# Patient Record
Sex: Female | Born: 1975 | Race: Black or African American | Hispanic: No | Marital: Married | Smoking: Never smoker
Health system: Southern US, Community
[De-identification: ages and names within clinical notes are randomized; demographics above are authoritative.]

## PROBLEM LIST (undated history)

## (undated) ENCOUNTER — Inpatient Hospital Stay (HOSPITAL_COMMUNITY): Payer: Self-pay

---

## 2001-12-03 ENCOUNTER — Other Ambulatory Visit: Admission: RE | Admit: 2001-12-03 | Discharge: 2001-12-03 | Payer: Self-pay | Admitting: Gynecology

## 2002-11-21 ENCOUNTER — Other Ambulatory Visit: Admission: RE | Admit: 2002-11-21 | Discharge: 2002-11-21 | Payer: Self-pay | Admitting: Gynecology

## 2003-11-24 ENCOUNTER — Other Ambulatory Visit: Admission: RE | Admit: 2003-11-24 | Discharge: 2003-11-24 | Payer: Self-pay | Admitting: Gynecology

## 2004-11-24 ENCOUNTER — Other Ambulatory Visit: Admission: RE | Admit: 2004-11-24 | Discharge: 2004-11-24 | Payer: Self-pay | Admitting: Gynecology

## 2005-12-02 ENCOUNTER — Other Ambulatory Visit: Admission: RE | Admit: 2005-12-02 | Discharge: 2005-12-02 | Payer: Self-pay | Admitting: Gynecology

## 2006-05-13 ENCOUNTER — Emergency Department (HOSPITAL_COMMUNITY): Admission: EM | Admit: 2006-05-13 | Discharge: 2006-05-13 | Payer: Self-pay | Admitting: Emergency Medicine

## 2006-10-28 ENCOUNTER — Inpatient Hospital Stay (HOSPITAL_COMMUNITY): Admission: AD | Admit: 2006-10-28 | Discharge: 2006-10-28 | Payer: Self-pay | Admitting: Obstetrics and Gynecology

## 2006-10-29 ENCOUNTER — Inpatient Hospital Stay (HOSPITAL_COMMUNITY): Admission: AD | Admit: 2006-10-29 | Discharge: 2006-10-31 | Payer: Self-pay | Admitting: Obstetrics and Gynecology

## 2007-12-16 ENCOUNTER — Inpatient Hospital Stay (HOSPITAL_COMMUNITY): Admission: AD | Admit: 2007-12-16 | Discharge: 2007-12-17 | Payer: Self-pay | Admitting: Obstetrics and Gynecology

## 2008-04-10 IMAGING — CR DG HAND COMPLETE 3+V*R*
3 series · 3 of 3 positions shown · non-contrast
Comparison: none

CLINICAL DATA: Right hand pain

RIGHT HAND - 3  VIEW:

[x hand ap right]
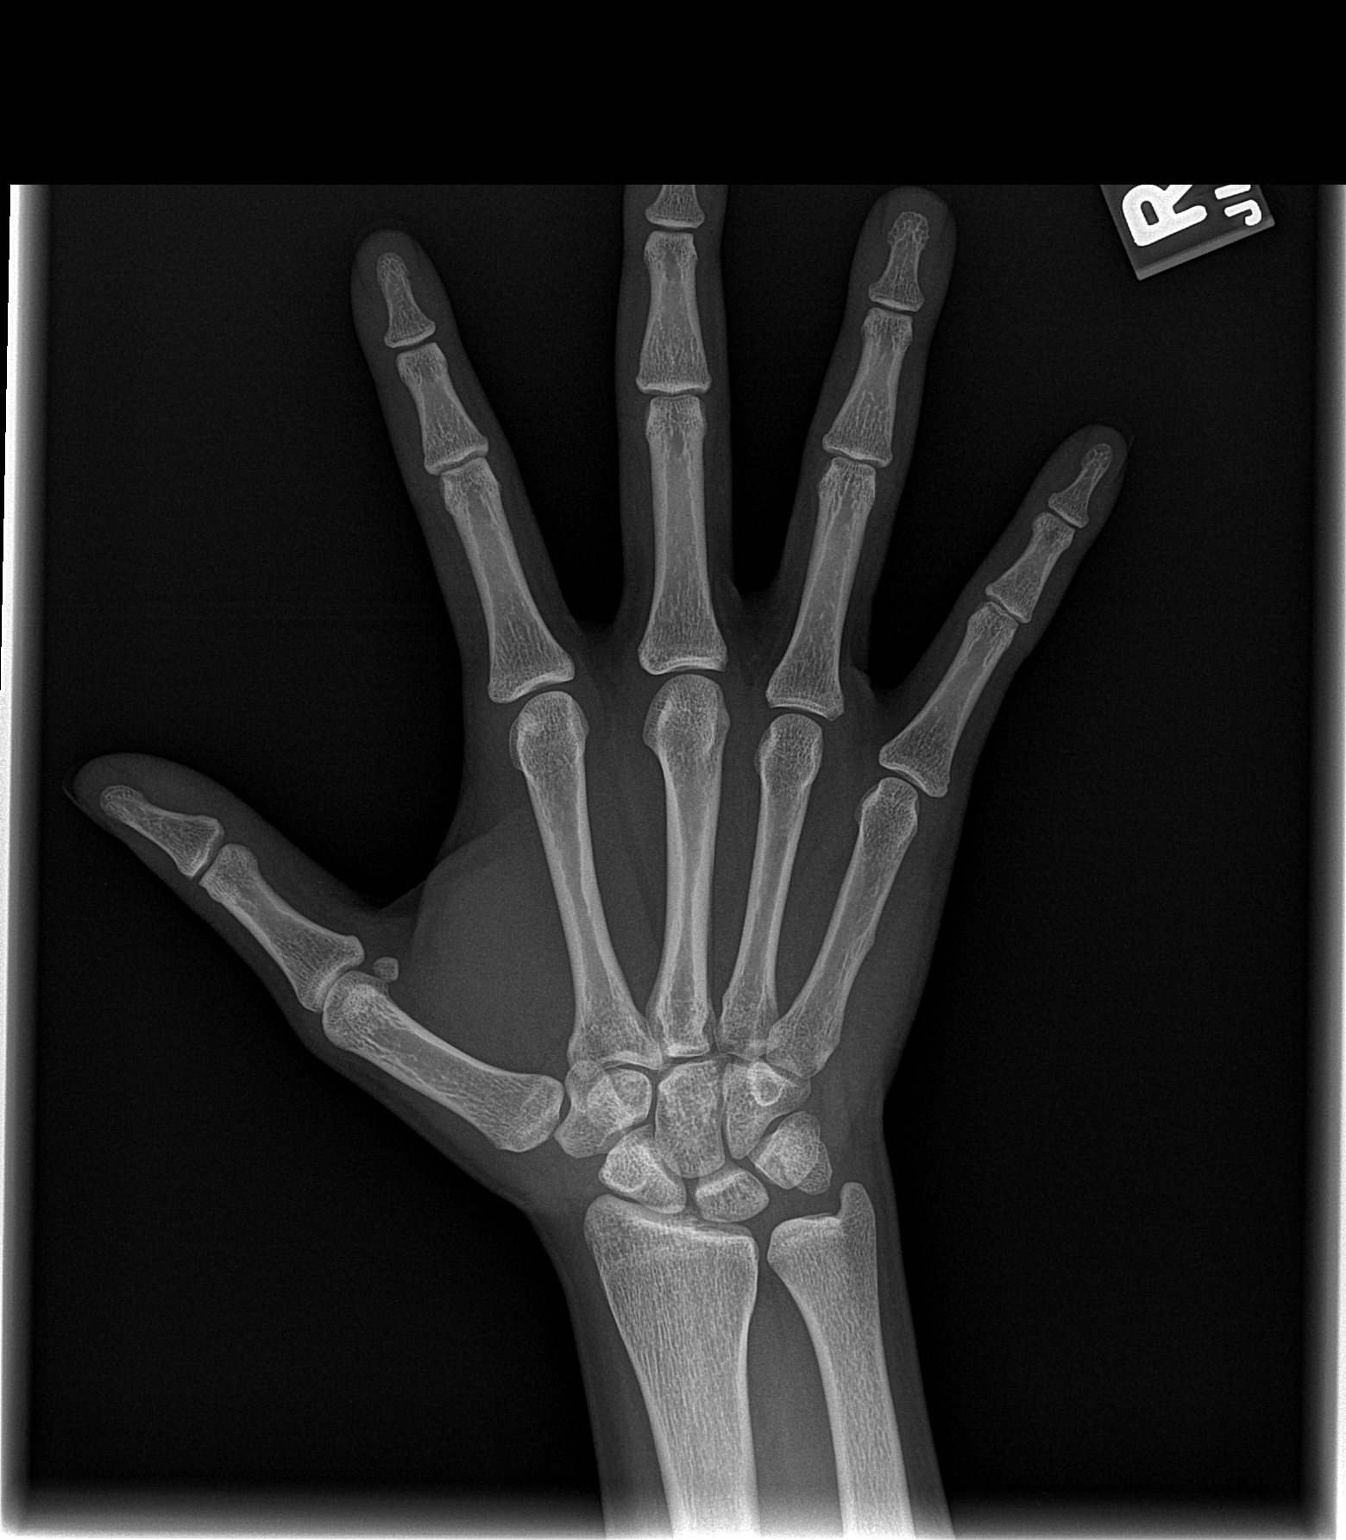

[x hand oblique right]
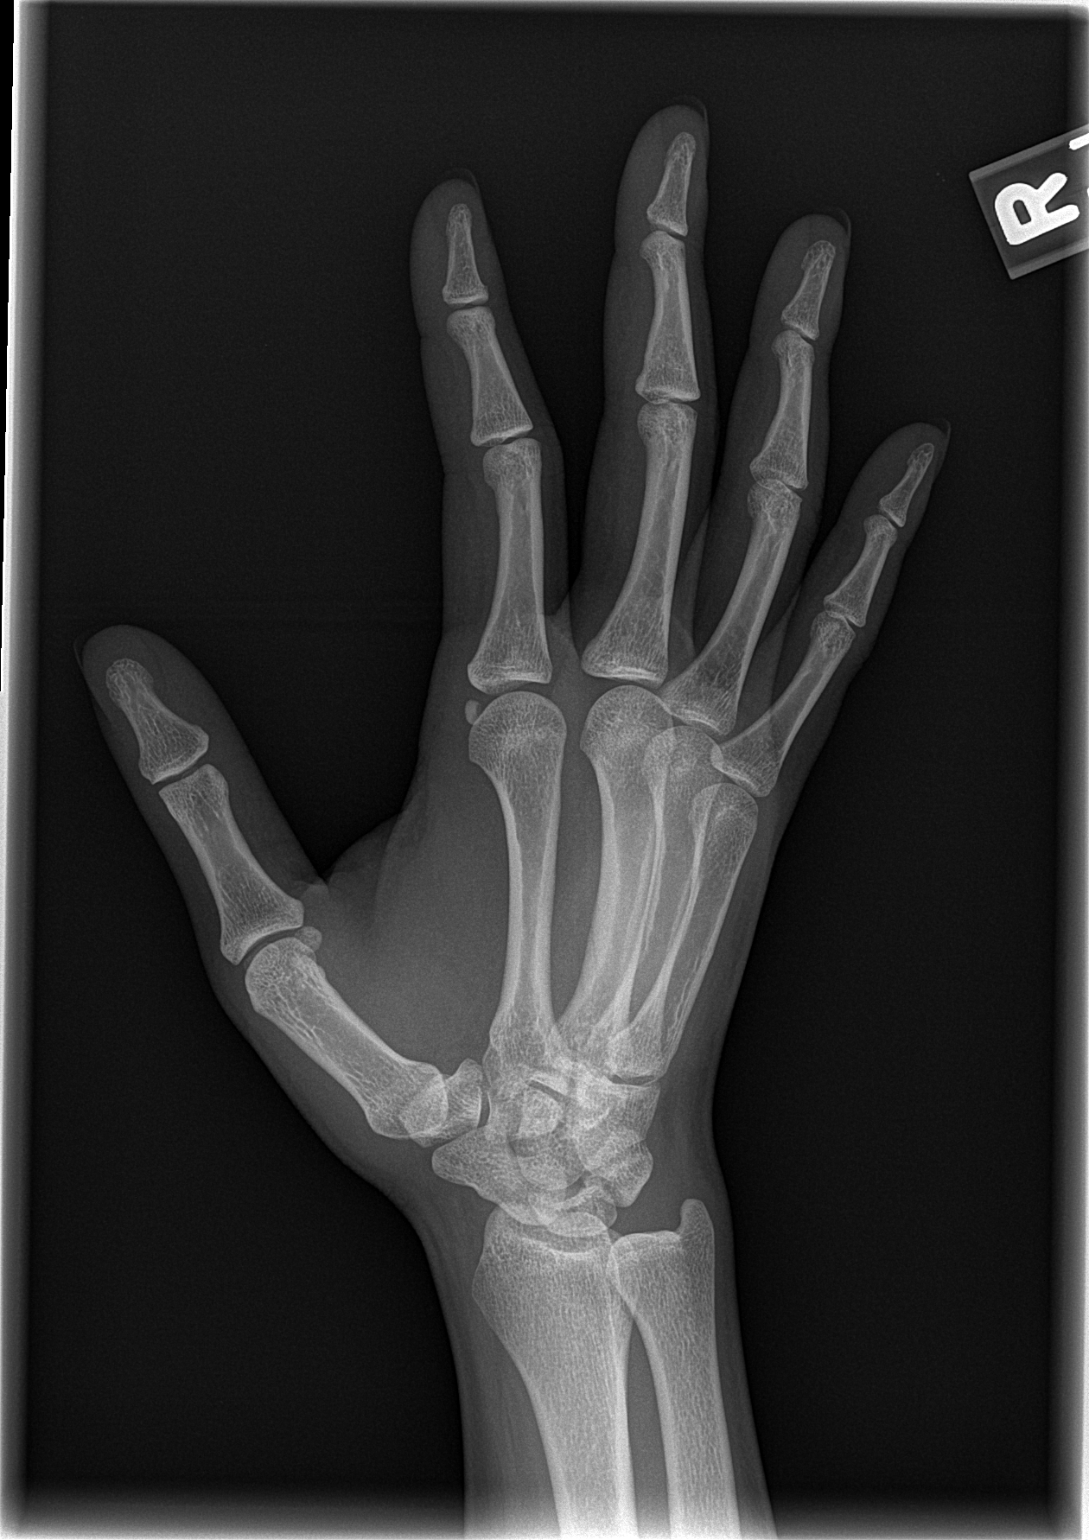

[x hand lat right]
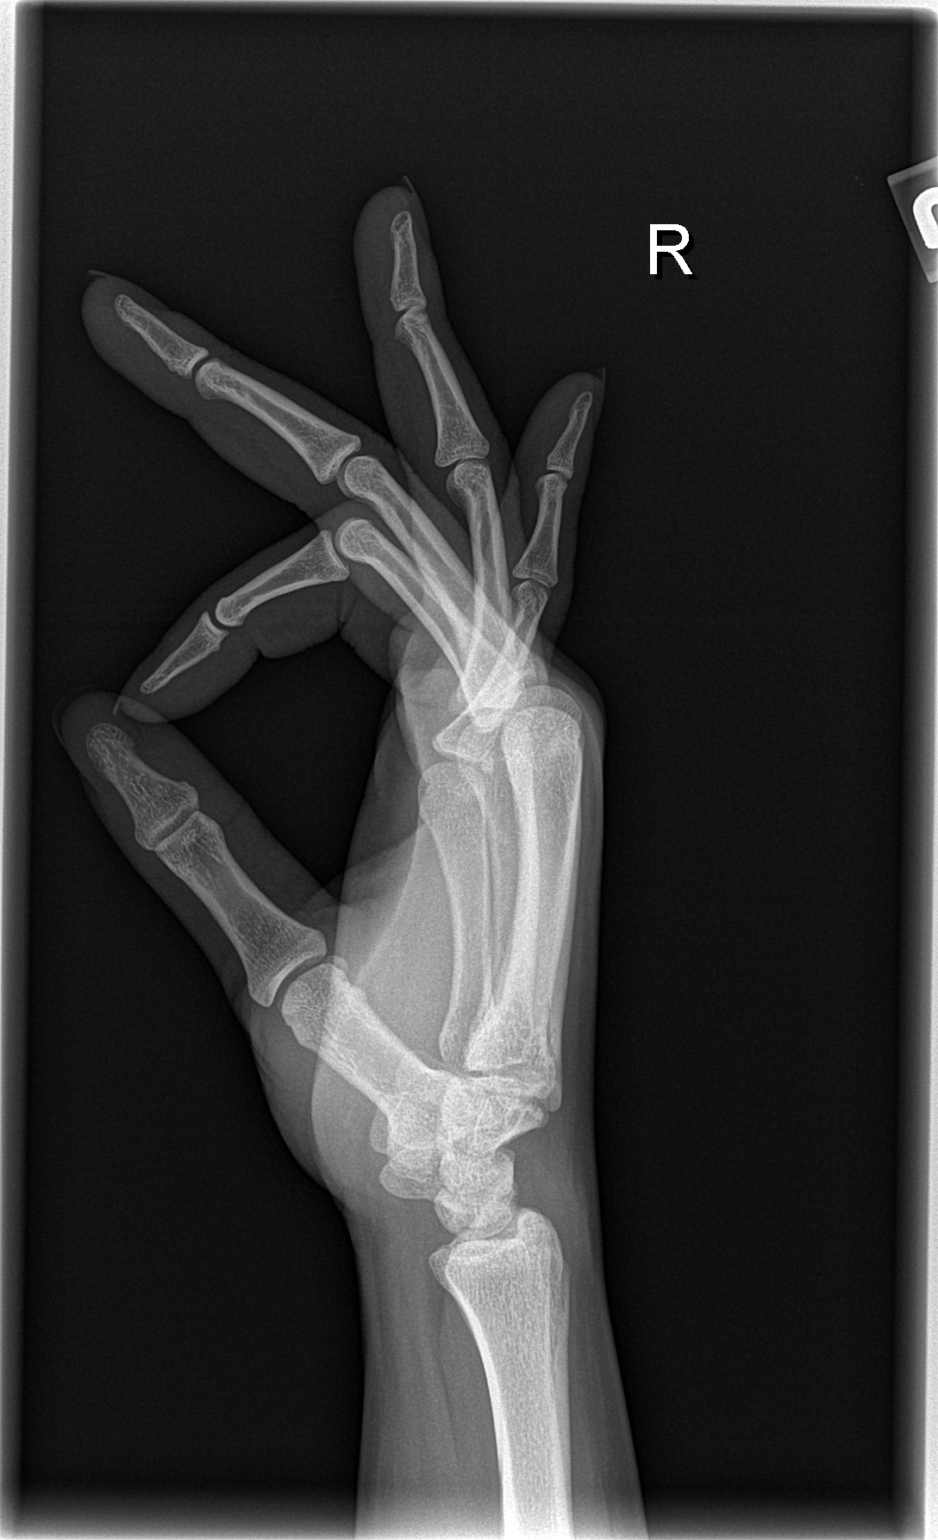

[3 of 3 positions shown; findings below may reference images not displayed]

FINDINGS: There is no evidence of fracture or dislocation.  There is no
evidence of arthropathy or other focal bone abnormality.  Soft tissues are
unremarkable.
IMPRESSION: Negative.

## 2009-05-24 ENCOUNTER — Inpatient Hospital Stay (HOSPITAL_COMMUNITY): Admission: AD | Admit: 2009-05-24 | Discharge: 2009-05-26 | Payer: Self-pay | Admitting: Obstetrics and Gynecology

## 2010-04-13 LAB — RPR: RPR Ser Ql: NONREACTIVE

## 2010-04-13 LAB — CBC
MCHC: 34.3 g/dL (ref 30.0–36.0)
MCV: 102.5 fL — ABNORMAL HIGH (ref 78.0–100.0)
Platelets: 136 10*3/uL — ABNORMAL LOW (ref 150–400)
Platelets: 159 10*3/uL (ref 150–400)
RDW: 13.4 % (ref 11.5–15.5)

## 2010-06-08 NOTE — H&P (Signed)
[name] Allen, [name] Allen                ACCOUNT NO.:  [id number]   MEDICAL RECORD NO.:  [id number]          PATIENT TYPE:  INP   LOCATION:  9170                          [facility]   PHYSICIAN:  Osborn Coho, M.D.   DATE OF BIRTH:  August 09, 1975   DATE OF ADMISSION:  12/16/2007  DATE OF DISCHARGE:                              HISTORY & PHYSICAL   [name] Allen is a 35 year old gravida 2, para 1-0-0-1 at 94 weeks who  presents today with spontaneous rupture of membranes at approximately  1:15 a.m. this morning with clear fluid noted and uterine contractions  every 2 minutes.  The patient has noted positive fetal movement.  She  denies any bleeding.  She had been 3-4 cm in the office this week.  Pregnancy remarkable for:   1. Second pregnancy in less than 1 year.  2. Questionable LMP.  3. Negative group B strep.  4. Some type of lip ulcers seen by a dermatologist that was treated      with antibiotics but no clear HSV diagnosis.   The patient also denies any prodrome or any other issues.   PRENATAL LABS:  Blood type is O+, Rh antibody negative, VDRL  nonreactive, rubella titer positive, hepatitis B surface antigen  negative, HIV nonreactive.  Hemoglobin electrophoresis was normal in  2008.  Cystic fibrosis testing was negative in 2008.  Pap was normal in  November of 2008.  GC and Chlamydia cultures were declined.  Hemoglobin  upon entering the practice was 13.7.  It was 12.9 at 27 weeks.  The  patient had a normal first trimester screen and declines AFP.  She had a  normal Glucola.  She had a Group B strep negative at 35 weeks and GC and  Chlamydia cultures were also negative at that time.   HISTORY OF PRESENT PREGNANCY:  The patient entered care at approximately  11 weeks.  She had an ultrasound at 9 weeks 4 days for dating secondary  to questionable LMP and also conception while breast-feeding.  The  patient a normal first trimester screen.  She declined AFP.  She had a  normal  ultrasound at 19 weeks showing normal growth and anterior  placenta and normal fluid.  The patient had a normal Glucola.  During  her pregnancy the patient was discussing issues of contraception and was  undecided about what she wanted to do.  She was having some lip ulcers  and fever type blisters.  She was seen by her primary and was prescribed  cephalexin.  She is group B strep culture at 36 weeks known negative  cultures.  She declined flu vaccines.  Rest of her pregnancy was  essentially uncomplicated.  Earlier this week she had an evaluation in  the office on Thursday showing cervix 3+.   OBSTETRICAL HISTORY:  In 2008 October she had a vaginal birth of a  female infant, weight 6 pounds 15 ounces at 40-2/7 weeks.  She was in  labor 15 hours.  She had epidural anesthesia.  She had no complications.   MEDICAL HISTORY:  She is a  previous condom, oral contraceptive Micronor  user.  She had one abnormal Pap in 2003.  In 2002 she had Chlamydia that  was treated.  She reports the usual childhood illnesses.  She has no  known medication allergies.   FAMILY HISTORY:  Paternal aunt had kidney stones.  She had a past  history that is remarkable for a first cousin having sickle cell trait.   SOCIAL HISTORY:  The patient is [place] and of the [place]  faith.  She is married to the father of baby.  He is involved and  supportive and his name is Chrishawn Kring.  The patient is college  educated.  She is a Teacher, early years/pre.  Her husband has 2 years of  college.  He is a Runner, broadcasting/film/video.  She has been followed by the certified nurse  midwife service [facility].  She denies any alcohol, drug or  tobacco use during this pregnancy.   PHYSICAL EXAM:  VITAL SIGNS:  Stable.  The patient is febrile.  HEENT:  Within normal limits.  LUNGS:  Breath sounds are clear.  HEART:  Regular rate and rhythm without murmur.  BREASTS:  Soft and nontender.  ABDOMEN:  Fundal height is approximately  39 cm.  Estimated fetal weight  is 7-7-1/2 pounds.  Uterine contractions every 2 minutes, moderate  quality.  The patient is noted to be leaking clear fluid.  Cervix is 5-  6, 80% vertex at a -1 station with verification of leaking fluid noted.  Fetal heart rate currently is nonreactive.  There are no decelerations  noted at present.  No HSV type lesions are noted on the vulva or vagina  or cervix.  EXTREMITIES:  Deep tendon reflexes are 2+ without clonus.  There is a  trace edema noted.   IMPRESSION:  1. Intrauterine pregnancy at 41 weeks.  2. Active labor with spontaneous rupture of membranes.  3. Group B strep negative.  4. No evidence of any HSV (herpes simplex virus) lesions.   PLAN:  1. Admit to birthing suite for consult with Dr. Osborn Coho as      attending physician.  2. Routine certified nurse midwife orders.  3. The patient does plan epidural.      Chip Boer L. Emilee Hero, C.N.M.      Osborn Coho, M.D.  Electronically Signed    VLL/MEDQ  D:  12/16/2007  T:  12/16/2007  Job:  [id number]

## 2010-06-08 NOTE — H&P (Signed)
[name], [name]                ACCOUNT NO.:  [id number]   MEDICAL RECORD NO.:  [id number]          PATIENT TYPE:  INP   LOCATION:  9162                          [facility]ACILITY:  [facility]   PHYSICIAN:  Naima A. Dillard, M.D. DATE OF BIRTH:  21-Sep-1975   DATE OF ADMISSION:  10/29/2006  DATE OF DISCHARGE:                              HISTORY & PHYSICAL   Patient is a 35 year old married African-American female gravida 1, para  0, who is admitted at 66 and 2/7 weeks with complaints of regular  uterine contractions with increased intensity since approximately 7 p.m.  October 28, 2006.  The patient was previously seen in the Maternity  Admissions Unit on October 3 and was sent home due to no cervical  change.  Patient denies rupture of membranes or bleeding.  Patient  reports that her fetus has been moving normally.  The patient has noted  some bloody mucus, however.  Patient denies any headaches, vision  changes, right upper quadrant pain as well.  Patient is group B strep  negative.  Patient's pregnancy is remarkable for first trimester  bleeding.  Patient's pregnancy has been followed by the CNM service at  [facility].   ALLERGIES:  No known drug allergies.   OBSTETRICAL HISTORY:  Pregnancy #1 is current.   GYN HISTORY:  Patient with:  1. History of Chlamydia in 2002, that was treated.  2. Occasional vaginal yeast and bacterial infections.  3. Patient repeats a history of abnormal Pap in the past in 2003 and      2004; however, no treatment, and repeat Pap was within normal      limits per patient.  4. Patient's last Pap smear November, 2007, which was normal.   PAST MEDICAL HISTORY:  Negative.   PAST SURGICAL HISTORY:  Negative.   FAMILY HISTORY:  1. First cousin with sickle cell trait.  2. Paternal aunt with kidney stones.   GENETIC HISTORY:  Negative with the exception of cousin with sickle cell  trait.   SOCIAL HISTORY:  1. Patient is married to the father of the  baby, [name] Allen, who is      involved and supportive.  2. Patient reports affiliation with [name] Allen of [employer].  3. 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  4. Father of the baby is a Runner, broadcasting/film/video with 14 years of education.  5. Patient denies any tobacco, alcohol or street drug use.   PRENATAL LABS:  Initial hemoglobin of 14.3, hematocrit 41.6, platelets  219,000, blood type O positive, antibody screen negative, rubella titer  immune.  Hepatitis B Surface Antigen negative.  Cystic fibrosis testing  was negative.  Glucola 94 at 27 weeks.  HSV-2 titers negative at 31  weeks.  Group B strep negative.  Gonorrhea and Chlamydia cultures  negative at 34 weeks and 6 days.  Gonorrhea was negative.   HISTORY OF CURRENT PREGNANCY:  Patient entered care at [redacted] weeks  gestation.  Patient desired first trimester screening.  Patient  underwent first trimester screening which was within normal limits.  HIV  is negative.  Hemoglobin electrophoresis also returned as normal from  records reviewed from November, 2007, at a previous [name] Allen.  Ultrasound was performed at 18 weeks with normal anatomy and growth  consistent with dates, cervix 3.22 cm and anterior placenta.  Glucola  was performed at 27 weeks.  Patient with complaints of numbness in her  right fifth toe at night at 30 weeks which resolved spontaneously.  Patient with complaints of itching of the external genitalia at 31 weeks  with lesions noted of the genitalia.  At that time HSV-2 glycoproteins  were drawn.  HSV-1 and 2 glycoproteins were negative.  The patient's  husband also underwent HSV glycoprotein testing due to the previous  occurrence and also returned with negative HSV glycoproteins.  Patient  with complaints of pelvic pressure as well as urinary frequency and pain  at 34 weeks and 6 days.  At that time cervix was found to be 1.5 cm, 75%  effaced.  Patient was encouraged to rest and increase her fluids and was   given preterm labor precautions at that time.  The patient had  gonorrhea, Chlamydia cultures and GBS done at that time.  At 36 weeks,  group B strep was repeated and found to be negative.  Remainder of  patient's pregnancy has been unremarkable.   OBJECTIVE:  VITAL SIGNS:  Stable.  Blood pressure 117/65.  HEENT:  Within normal limits.  THYROID:  Normal.  CHEST:  Clear.  HEART:  Regular rate and rhythm.  ABDOMEN:  Gravid and nontender.  UTERUS:  Nontender.  FETAL HEART RATE:  Overall reassuring with variability present and 10 x  10 excels present but not technically reactive.  Uterine contractions  are noted to be every 2 to 4 minutes, strong to palpation.  CERVIX:  Cervical exam finds cervix to be 4 to 5 cm dilated, 80%  effaced, -2 station.  Vertex with a bulging bag of water.  A small  amount of bloody show.  EXTREMITIES:  With no edema noted.  Negative Homan's bilaterally.  Deep  tendon reflexes are 2+ with no clonus.   ASSESSMENT:  1. Intrauterine pregnancy at 40 and 2/[redacted] weeks gestation.  2. Labor.   PLAN:  1. Patient to be admitted to birthing suites per consult with Dr.      Normand Sloop.  2. Patient desires epidural anesthesia during labor which was ordered.  3. Routine admission orders.  4. Will observe carefully and continue expectant management at the      present time.      [name] Allen, CNM      Naima A. Normand Sloop, M.D.  Electronically Signed   NOS/MEDQ  D:  10/29/2006  T:  10/29/2006  Job:  [id number]

## 2010-10-26 LAB — CBC
HCT: 33.7 — ABNORMAL LOW
Hemoglobin: 14.1
MCV: 104.8 — ABNORMAL HIGH
RBC: 3.21 — ABNORMAL LOW
RBC: 3.97
RDW: 12.6
WBC: 10.1

## 2010-11-04 LAB — CBC
HCT: 37.3
Hemoglobin: 10.5 — ABNORMAL LOW
Hemoglobin: 13.1
MCHC: 35.3
MCV: 100.4 — ABNORMAL HIGH
MCV: 99.7
Platelets: 195
RDW: 12.8
WBC: 12.6 — ABNORMAL HIGH

## 2015-01-21 LAB — OB RESULTS CONSOLE GC/CHLAMYDIA
Chlamydia: NEGATIVE
GC PROBE AMP, GENITAL: NEGATIVE

## 2015-01-21 LAB — OB RESULTS CONSOLE ABO/RH: RH TYPE: POSITIVE

## 2015-01-21 LAB — OB RESULTS CONSOLE HEPATITIS B SURFACE ANTIGEN: HEP B S AG: NEGATIVE

## 2015-01-21 LAB — OB RESULTS CONSOLE ANTIBODY SCREEN: ANTIBODY SCREEN: NEGATIVE

## 2015-01-21 LAB — OB RESULTS CONSOLE RPR: RPR: NONREACTIVE

## 2015-01-21 LAB — OB RESULTS CONSOLE RUBELLA ANTIBODY, IGM: RUBELLA: IMMUNE

## 2015-01-21 LAB — OB RESULTS CONSOLE HIV ANTIBODY (ROUTINE TESTING): HIV: NONREACTIVE

## 2015-01-25 NOTE — L&D Delivery Note (Addendum)
Randell LoopHolmes, Jeannene S Female, 39 y.o., [birth year]  Delivery Note At 4:24 PM a  viable female was delivered via Vaginal, Spontaneous Delivery (Presentation:ROA ).  APGAR: 8, 9; weight unknown.   Placenta status: Delivered with gentle traction, grossly normal and intact with 3 vessel cord, handed over for cord blood banking blood collection.    Anesthesia:  Epidural Episiotomy: None Lacerations: Small superficial perineal lacerations hemostatic, not repaired.   Suture Repair: None Est. Blood Loss (mL): 200  Mom to postpartum.  Baby to Couplet care / Skin to Skin.  Konrad FelixKULWA,Noralee Dutko WAKURU, MD.  09/14/2015, 5:15 PM

## 2015-06-19 LAB — OB RESULTS CONSOLE RPR: RPR: NONREACTIVE

## 2015-06-19 LAB — OB RESULTS CONSOLE HIV ANTIBODY (ROUTINE TESTING): HIV: NONREACTIVE

## 2015-08-06 LAB — OB RESULTS CONSOLE GBS: STREP GROUP B AG: NEGATIVE

## 2015-08-23 ENCOUNTER — Encounter (HOSPITAL_COMMUNITY): Payer: Self-pay

## 2015-08-23 ENCOUNTER — Inpatient Hospital Stay (HOSPITAL_COMMUNITY)
Admission: AD | Admit: 2015-08-23 | Discharge: 2015-08-23 | Disposition: A | Discharge status: Home / Self Care | Payer: Medicaid Other | Source: Ambulatory Visit | Attending: Obstetrics & Gynecology | Admitting: Obstetrics & Gynecology

## 2015-08-23 DIAGNOSIS — O09299 Supervision of pregnancy with other poor reproductive or obstetric history, unspecified trimester: Secondary | ICD-10-CM

## 2015-08-23 DIAGNOSIS — O09523 Supervision of elderly multigravida, third trimester: Secondary | ICD-10-CM | POA: Diagnosis not present

## 2015-08-23 DIAGNOSIS — Z8673 Personal history of transient ischemic attack (TIA), and cerebral infarction without residual deficits: Secondary | ICD-10-CM | POA: Diagnosis not present

## 2015-08-23 DIAGNOSIS — Z3A37 37 weeks gestation of pregnancy: Secondary | ICD-10-CM | POA: Insufficient documentation

## 2015-08-23 DIAGNOSIS — Z85118 Personal history of other malignant neoplasm of bronchus and lung: Secondary | ICD-10-CM | POA: Insufficient documentation

## 2015-08-23 DIAGNOSIS — O09529 Supervision of elderly multigravida, unspecified trimester: Secondary | ICD-10-CM

## 2015-08-23 NOTE — Discharge Instructions (Signed)
Braxton Hicks Contractions °Contractions of the uterus can occur throughout pregnancy. Contractions are not always a sign that you are in labor.  °WHAT ARE BRAXTON HICKS CONTRACTIONS?  °Contractions that occur before labor are called Braxton Hicks contractions, or false labor. Toward the end of pregnancy (32-34 weeks), these contractions can develop more often and may become more forceful. This is not true labor because these contractions do not result in opening (dilatation) and thinning of the cervix. They are sometimes difficult to tell apart from true labor because these contractions can be forceful and people have different pain tolerances. You should not feel embarrassed if you go to the hospital with false labor. Sometimes, the only way to tell if you are in true labor is for your health care provider to look for changes in the cervix. °If there are no prenatal problems or other health problems associated with the pregnancy, it is completely safe to be sent home with false labor and await the onset of true labor. °HOW CAN YOU TELL THE DIFFERENCE BETWEEN TRUE AND FALSE LABOR? °False Labor °· The contractions of false labor are usually shorter and not as hard as those of true labor.   °· The contractions are usually irregular.   °· The contractions are often felt in the front of the lower abdomen and in the groin.   °· The contractions may go away when you walk around or change positions while lying down.   °· The contractions get weaker and are shorter lasting as time goes on.   °· The contractions do not usually become progressively stronger, regular, and closer together as with true labor.   °True Labor °· Contractions in true labor last 30-70 seconds, become very regular, usually become more intense, and increase in frequency.   °· The contractions do not go away with walking.   °· The discomfort is usually felt in the top of the uterus and spreads to the lower abdomen and low back.   °· True labor can be  determined by your health care provider with an exam. This will show that the cervix is dilating and getting thinner.   °WHAT TO REMEMBER °· Keep up with your usual exercises and follow other instructions given by your health care provider.   °· Take medicines as directed by your health care provider.   °· Keep your regular prenatal appointments.   °· Eat and drink lightly if you think you are going into labor.   °· If Braxton Hicks contractions are making you uncomfortable:   °¨ Change your position from lying down or resting to walking, or from walking to resting.   °¨ Sit and rest in a tub of warm water.   °¨ Drink 2-3 glasses of water. Dehydration may cause these contractions.   °¨ Do slow and deep breathing several times an hour.   °WHEN SHOULD I SEEK IMMEDIATE MEDICAL CARE? °Seek immediate medical care if: °· Your contractions become stronger, more regular, and closer together.   °· You have fluid leaking or gushing from your vagina.   °· You have a fever.   °· You pass blood-tinged mucus.   °· You have vaginal bleeding.   °· You have continuous abdominal pain.   °· You have low back pain that you never had before.   °· You feel your baby's head pushing down and causing pelvic pressure.   °· Your baby is not moving as much as it used to.   °  °This information is not intended to replace advice given to you by your health care provider. Make sure you discuss any questions you have with your health care   provider. °  °Document Released: 01/10/2005 Document Revised: 01/15/2013 Document Reviewed: 10/22/2012 °Elsevier Interactive Patient Education ©2016 Elsevier Inc. ° °

## 2015-08-23 NOTE — MAU Provider Note (Signed)
  History   40 yo [id number] at 69 6/7 weeks presented with persistent contractions since around 2pm today, some closer than before.  Denies leaking or bleeding, reports +FM.  Cervix was 3 cm in office last week.  In NAD, "contractions have just continued".    Evaluated on admission in MAU by RN, with cervix 4 cm, 50%, vtx, -3.  Observed x 2 hours, with recheck showing cervix essentially unchanged, but UCs still present, although somewhat irregular in pattern.    Hx mild shoulder dystocia with 2nd delivery (baby 8+5 at 41 weeks, 1st and 3rd babies 6+14 and 6+15), declined elective C/S.    Partner with hx of HSV exposure in past, patient with "equivocal HSV" result per verbal report.  Review of actual report showed HSV 1&2 negative on 08/06/15.  Patient without hx of HSV outbreaks or prodrome.  Patient Active Problem List   Diagnosis Date Noted  . AMA (advanced maternal age) multigravida 35+ 08/23/2015  . H/O shoulder dystocia in prior pregnancy, currently pregnant 08/23/2015    Chief Complaint  Patient presents with  . Contractions   HPI:  As above  OB History    Gravida Para Term Preterm AB Living   4 3 3     3    SAB TAB Ectopic Multiple Live Births           3    2008--SVB, 40 weeks, 6+14, with [facility], female 2009--SVB, 41 weeks, 8+5, mild shoulder dystocia, with [facility], female 2011--SVB, 40 weeks, 6+15, with [facility], female  Medical Hx:  Migraines  History reviewed. No pertinent surgical history.  Family Hx:  MGM CVA, breast cancer, lung cancer  Social History  Substance Use Topics  . Smoking status: Never Smoker  . Smokeless tobacco: Never Used  . Alcohol use No  Patient is African American, of the [place] faith, married to [place], who is involved and supportive.  She is graduate educated, works as a Dietitian.   Prenatal Transfer Tool  Maternal Diabetes: No Genetic Screening: Declined Maternal Ultrasounds/Referrals: Normal Fetal Ultrasounds or other Referrals:   None Maternal Substance Abuse:  No Significant Maternal Medications:  None Significant Maternal Lab Results: Lab values include: Group B Strep negative    Allergies: NKA  No prescriptions prior to admission.    ROS:  Contractions, +FM Physical Exam   Temperature 98.5 F (36.9 C), temperature source Oral, resp. rate 16, height 5' 1.5" (1.562 m), weight 77.1 kg (170 lb).    Physical Exam  In NAD Chest clear Heart RRR without murmur Abd gravid, NR Pelvic--cervix 4 cm, 50%, vtx, slightly ballotable, IBOW, cervix firm Ext WNL  FHR Category 1 UCs q 3-6 min, mild.  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  If no cervical change, will d/c home per patient request.   Nigel Bridgeman CNM, MSN 08/23/2015 7:57 PM   Addendum: Ambulated x 1 hour, no change in UCs, some very irregular and infrequent.  Cervix unchanged--4 cm, 50%, vtx, ballotable, -3, IBOW. FHR Category 1 UCs irregular, mild.  Patient wants to "let nature take its course"--would not seek intervention at present.  D/C home with labor precautions. Keep scheduled appt at [facility] next week. Call/return prn.  Nigel Bridgeman, CNM 08/23/15 9p

## 2015-08-23 NOTE — MAU Note (Signed)
Pt has been having contractions.  Timed them about every 3 min

## 2015-08-23 NOTE — MAU Note (Signed)
Urine sent to lab 

## 2015-08-23 NOTE — MAU Note (Signed)
Received call from Dr Sallye Ober with test results for patient.  GBS was negative and HSV was equivocal.

## 2015-09-06 ENCOUNTER — Inpatient Hospital Stay (HOSPITAL_COMMUNITY)
Admission: AD | Admit: 2015-09-06 | Discharge: 2015-09-06 | Disposition: A | Discharge status: Home / Self Care | Payer: Medicaid Other | Source: Ambulatory Visit | Attending: Obstetrics and Gynecology | Admitting: Obstetrics and Gynecology

## 2015-09-06 ENCOUNTER — Encounter (HOSPITAL_COMMUNITY): Payer: Self-pay

## 2015-09-06 DIAGNOSIS — Z3A39 39 weeks gestation of pregnancy: Secondary | ICD-10-CM | POA: Diagnosis not present

## 2015-09-06 DIAGNOSIS — O471 False labor at or after 37 completed weeks of gestation: Secondary | ICD-10-CM | POA: Diagnosis present

## 2015-09-06 LAB — AMNISURE RUPTURE OF MEMBRANE (ROM) NOT AT ARMC: AMNISURE: NEGATIVE

## 2015-09-06 NOTE — Discharge Instructions (Signed)
Braxton Hicks Contractions °Contractions of the uterus can occur throughout pregnancy. Contractions are not always a sign that you are in labor.  °WHAT ARE BRAXTON HICKS CONTRACTIONS?  °Contractions that occur before labor are called Braxton Hicks contractions, or false labor. Toward the end of pregnancy (32-34 weeks), these contractions can develop more often and may become more forceful. This is not true labor because these contractions do not result in opening (dilatation) and thinning of the cervix. They are sometimes difficult to tell apart from true labor because these contractions can be forceful and people have different pain tolerances. You should not feel embarrassed if you go to the hospital with false labor. Sometimes, the only way to tell if you are in true labor is for your health care provider to look for changes in the cervix. °If there are no prenatal problems or other health problems associated with the pregnancy, it is completely safe to be sent home with false labor and await the onset of true labor. °HOW CAN YOU TELL THE DIFFERENCE BETWEEN TRUE AND FALSE LABOR? °False Labor °· The contractions of false labor are usually shorter and not as hard as those of true labor.   °· The contractions are usually irregular.   °· The contractions are often felt in the front of the lower abdomen and in the groin.   °· The contractions may go away when you walk around or change positions while lying down.   °· The contractions get weaker and are shorter lasting as time goes on.   °· The contractions do not usually become progressively stronger, regular, and closer together as with true labor.   °True Labor °· Contractions in true labor last 30-70 seconds, become very regular, usually become more intense, and increase in frequency.   °· The contractions do not go away with walking.   °· The discomfort is usually felt in the top of the uterus and spreads to the lower abdomen and low back.   °· True labor can be  determined by your health care provider with an exam. This will show that the cervix is dilating and getting thinner.   °WHAT TO REMEMBER °· Keep up with your usual exercises and follow other instructions given by your health care provider.   °· Take medicines as directed by your health care provider.   °· Keep your regular prenatal appointments.   °· Eat and drink lightly if you think you are going into labor.   °· If Braxton Hicks contractions are making you uncomfortable:   °¨ Change your position from lying down or resting to walking, or from walking to resting.   °¨ Sit and rest in a tub of warm water.   °¨ Drink 2-3 glasses of water. Dehydration may cause these contractions.   °¨ Do slow and deep breathing several times an hour.   °WHEN SHOULD I SEEK IMMEDIATE MEDICAL CARE? °Seek immediate medical care if: °· Your contractions become stronger, more regular, and closer together.   °· You have fluid leaking or gushing from your vagina.   °· You have a fever.   °· You pass blood-tinged mucus.   °· You have vaginal bleeding.   °· You have continuous abdominal pain.   °· You have low back pain that you never had before.   °· You feel your baby's head pushing down and causing pelvic pressure.   °· Your baby is not moving as much as it used to.   °  °This information is not intended to replace advice given to you by your health care provider. Make sure you discuss any questions you have with your health care   provider. °  °Document Released: 01/10/2005 Document Revised: 01/15/2013 Document Reviewed: 10/22/2012 °Elsevier Interactive Patient Education ©2016 Elsevier Inc. ° °

## 2015-09-06 NOTE — MAU Note (Signed)
Contractions since 7 pm. Was 4 cm at last appointment.  Ctxs every 5-7 min apart. No bleeding. Possible leaking when first arrived here.  Baby moving well.

## 2015-09-11 ENCOUNTER — Other Ambulatory Visit: Payer: Self-pay | Admitting: Obstetrics and Gynecology

## 2015-09-12 ENCOUNTER — Inpatient Hospital Stay (HOSPITAL_COMMUNITY)
Admission: AD | Admit: 2015-09-12 | Discharge: 2015-09-12 | Disposition: A | Discharge status: Home / Self Care | Payer: Medicaid Other | Attending: Obstetrics and Gynecology | Admitting: Obstetrics and Gynecology

## 2015-09-12 ENCOUNTER — Encounter (HOSPITAL_COMMUNITY): Payer: Self-pay | Admitting: *Deleted

## 2015-09-12 DIAGNOSIS — O471 False labor at or after 37 completed weeks of gestation: Secondary | ICD-10-CM | POA: Diagnosis not present

## 2015-09-12 DIAGNOSIS — Z3A40 40 weeks gestation of pregnancy: Secondary | ICD-10-CM | POA: Insufficient documentation

## 2015-09-12 LAB — POCT FERN TEST

## 2015-09-12 NOTE — MAU Note (Signed)
Pt states ?SROM at 11:00pm-clear. Some contractions every 5-7. Denies vag bleeding. +FM. Cervix was 4cm on Thursday. GBS -

## 2015-09-12 NOTE — MAU Note (Signed)
LOF, +FM, Denies VB, Irregular ctx.

## 2015-09-12 NOTE — MAU Note (Signed)
Lawson FiscalLori, CNM notified of patients arrival, bag of water felt intact during SVE. Fern-, pt discharged with labor precautions.

## 2015-09-14 ENCOUNTER — Inpatient Hospital Stay (HOSPITAL_COMMUNITY): Payer: Medicaid Other | Admitting: Anesthesiology

## 2015-09-14 ENCOUNTER — Inpatient Hospital Stay (HOSPITAL_COMMUNITY)
Admission: RE | Admit: 2015-09-14 | Discharge: 2015-09-15 | DRG: 775 | Disposition: A | Discharge status: Home / Self Care | Payer: Medicaid Other | Source: Ambulatory Visit | Attending: Obstetrics & Gynecology | Admitting: Obstetrics & Gynecology

## 2015-09-14 ENCOUNTER — Encounter (HOSPITAL_COMMUNITY): Payer: Self-pay

## 2015-09-14 DIAGNOSIS — Z3A41 41 weeks gestation of pregnancy: Secondary | ICD-10-CM | POA: Diagnosis not present

## 2015-09-14 DIAGNOSIS — O480 Post-term pregnancy: Principal | ICD-10-CM | POA: Diagnosis present

## 2015-09-14 LAB — CBC
HEMATOCRIT: 36.5 % (ref 36.0–46.0)
HEMOGLOBIN: 12.4 g/dL (ref 12.0–15.0)
MCH: 32.4 pg (ref 26.0–34.0)
MCHC: 34 g/dL (ref 30.0–36.0)
MCV: 95.3 fL (ref 78.0–100.0)
Platelets: 186 10*3/uL (ref 150–400)
RBC: 3.83 MIL/uL — AB (ref 3.87–5.11)
RDW: 13.1 % (ref 11.5–15.5)
WBC: 8.3 10*3/uL (ref 4.0–10.5)

## 2015-09-14 LAB — TYPE AND SCREEN
ABO/RH(D): O POS
Antibody Screen: NEGATIVE

## 2015-09-14 LAB — RPR: RPR: NONREACTIVE

## 2015-09-14 LAB — ABO/RH: ABO/RH(D): O POS

## 2015-09-14 MED ORDER — LIDOCAINE HCL (PF) 1 % IJ SOLN
30.0000 mL | INTRAMUSCULAR | Status: DC | PRN
Start: 2015-09-14 — End: 2015-09-15
  Filled 2015-09-14: qty 30

## 2015-09-14 MED ORDER — ACETAMINOPHEN 325 MG PO TABS: 650.0000 mg | ORAL_TABLET | ORAL | Status: DC | PRN

## 2015-09-14 MED ORDER — DIPHENHYDRAMINE HCL 25 MG PO CAPS: 25.0000 mg | ORAL_CAPSULE | Freq: Four times a day (QID) | ORAL | Status: DC | PRN

## 2015-09-14 MED ORDER — ZOLPIDEM TARTRATE 5 MG PO TABS: 5.0000 mg | ORAL_TABLET | Freq: Every evening | ORAL | Status: DC | PRN

## 2015-09-14 MED ORDER — ONDANSETRON HCL 4 MG PO TABS: 4.0000 mg | ORAL_TABLET | ORAL | Status: DC | PRN

## 2015-09-14 MED ORDER — LACTATED RINGERS IV SOLN
INTRAVENOUS | Status: DC
  Administered 2015-09-14 (×2): 125 mL/h via INTRAVENOUS

## 2015-09-14 MED ORDER — LACTATED RINGERS IV SOLN
500.0000 mL | Freq: Once | INTRAVENOUS | Status: AC
  Administered 2015-09-14: 500 mL via INTRAVENOUS

## 2015-09-14 MED ORDER — LIDOCAINE HCL (PF) 1 % IJ SOLN
INTRAMUSCULAR | Status: DC | PRN
  Administered 2015-09-14: 6 mL via EPIDURAL
  Administered 2015-09-14: 4 mL

## 2015-09-14 MED ORDER — ONDANSETRON HCL 4 MG/2ML IJ SOLN: 4.0000 mg | Freq: Four times a day (QID) | INTRAMUSCULAR | Status: DC | PRN

## 2015-09-14 MED ORDER — LACTATED RINGERS IV SOLN: 500.0000 mL | INTRAVENOUS | Status: DC | PRN

## 2015-09-14 MED ORDER — OXYTOCIN BOLUS FROM INFUSION
500.0000 mL | Freq: Once | INTRAVENOUS | Status: AC
  Administered 2015-09-14: 500 mL via INTRAVENOUS

## 2015-09-14 MED ORDER — SOD CITRATE-CITRIC ACID 500-334 MG/5ML PO SOLN: 30.0000 mL | ORAL | Status: DC | PRN

## 2015-09-14 MED ORDER — TERBUTALINE SULFATE 1 MG/ML IJ SOLN
0.2500 mg | Freq: Once | INTRAMUSCULAR | Status: DC | PRN
  Filled 2015-09-14: qty 1

## 2015-09-14 MED ORDER — FLEET ENEMA 7-19 GM/118ML RE ENEM: 1.0000 | ENEMA | RECTAL | Status: DC | PRN

## 2015-09-14 MED ORDER — DIPHENHYDRAMINE HCL 50 MG/ML IJ SOLN: 12.5000 mg | INTRAMUSCULAR | Status: DC | PRN

## 2015-09-14 MED ORDER — SENNOSIDES-DOCUSATE SODIUM 8.6-50 MG PO TABS
2.0000 | ORAL_TABLET | ORAL | Status: DC
  Administered 2015-09-15: 2 via ORAL
  Filled 2015-09-14: qty 2

## 2015-09-14 MED ORDER — DIBUCAINE 1 % RE OINT: 1.0000 "application " | TOPICAL_OINTMENT | RECTAL | Status: DC | PRN

## 2015-09-14 MED ORDER — EPHEDRINE 5 MG/ML INJ
10.0000 mg | INTRAVENOUS | Status: DC | PRN
  Filled 2015-09-14: qty 4

## 2015-09-14 MED ORDER — PHENYLEPHRINE 40 MCG/ML (10ML) SYRINGE FOR IV PUSH (FOR BLOOD PRESSURE SUPPORT)
80.0000 ug | PREFILLED_SYRINGE | INTRAVENOUS | Status: DC | PRN
  Filled 2015-09-14: qty 10
  Filled 2015-09-14: qty 5

## 2015-09-14 MED ORDER — OXYTOCIN 40 UNITS IN LACTATED RINGERS INFUSION - SIMPLE MED: 2.5000 [IU]/h | INTRAVENOUS | Status: DC

## 2015-09-14 MED ORDER — FENTANYL 2.5 MCG/ML BUPIVACAINE 1/10 % EPIDURAL INFUSION (WH - ANES)
14.0000 mL/h | INTRAMUSCULAR | Status: DC | PRN
  Administered 2015-09-14: 14 mL/h via EPIDURAL
  Filled 2015-09-14: qty 125

## 2015-09-14 MED ORDER — WITCH HAZEL-GLYCERIN EX PADS: 1.0000 "application " | MEDICATED_PAD | CUTANEOUS | Status: DC | PRN

## 2015-09-14 MED ORDER — PHENYLEPHRINE 40 MCG/ML (10ML) SYRINGE FOR IV PUSH (FOR BLOOD PRESSURE SUPPORT)
80.0000 ug | PREFILLED_SYRINGE | INTRAVENOUS | Status: DC | PRN
  Administered 2015-09-14: 80 ug via INTRAVENOUS
  Filled 2015-09-14: qty 5

## 2015-09-14 MED ORDER — IBUPROFEN 600 MG PO TABS
600.0000 mg | ORAL_TABLET | Freq: Four times a day (QID) | ORAL | Status: DC
  Administered 2015-09-14 – 2015-09-15 (×5): 600 mg via ORAL
  Filled 2015-09-14 (×5): qty 1

## 2015-09-14 MED ORDER — PRENATAL MULTIVITAMIN CH
1.0000 | ORAL_TABLET | Freq: Every day | ORAL | Status: DC
  Administered 2015-09-15: 1 via ORAL
  Filled 2015-09-14: qty 1

## 2015-09-14 MED ORDER — COCONUT OIL OIL: 1.0000 "application " | TOPICAL_OIL | Status: DC | PRN

## 2015-09-14 MED ORDER — OXYTOCIN 40 UNITS IN LACTATED RINGERS INFUSION - SIMPLE MED: 1.0000 m[IU]/min | INTRAVENOUS | Status: DC

## 2015-09-14 MED ORDER — TETANUS-DIPHTH-ACELL PERTUSSIS 5-2.5-18.5 LF-MCG/0.5 IM SUSP
0.5000 mL | Freq: Once | INTRAMUSCULAR | Status: AC
  Administered 2015-09-15: 0.5 mL via INTRAMUSCULAR
  Filled 2015-09-14: qty 0.5

## 2015-09-14 MED ORDER — FENTANYL CITRATE (PF) 100 MCG/2ML IJ SOLN: 50.0000 ug | INTRAMUSCULAR | Status: DC | PRN

## 2015-09-14 MED ORDER — MISOPROSTOL 25 MCG QUARTER TABLET
25.0000 ug | ORAL_TABLET | ORAL | Status: DC | PRN
  Filled 2015-09-14: qty 1
  Filled 2015-09-14: qty 0.25

## 2015-09-14 MED ORDER — SIMETHICONE 80 MG PO CHEW: 80.0000 mg | CHEWABLE_TABLET | ORAL | Status: DC | PRN

## 2015-09-14 MED ORDER — BENZOCAINE-MENTHOL 20-0.5 % EX AERO
1.0000 "application " | INHALATION_SPRAY | CUTANEOUS | Status: DC | PRN
  Administered 2015-09-14 – 2015-09-15 (×2): 1 via TOPICAL
  Filled 2015-09-14 (×2): qty 56

## 2015-09-14 MED ORDER — OXYTOCIN 40 UNITS IN LACTATED RINGERS INFUSION - SIMPLE MED
1.0000 m[IU]/min | INTRAVENOUS | Status: DC
  Administered 2015-09-14: 1 m[IU]/min via INTRAVENOUS
  Filled 2015-09-14: qty 1000

## 2015-09-14 MED ORDER — ONDANSETRON HCL 4 MG/2ML IJ SOLN: 4.0000 mg | INTRAMUSCULAR | Status: DC | PRN

## 2015-09-14 NOTE — Lactation Note (Signed)
This note was copied from a baby's chart. Lactation Consultation Note Initial visit at 5 hours of age.  Mom has baby latched to right breast and is hearing swallows.  Mom has experience with 3 older children breast feeding for about 10 months each.  Mom denies pain or concerns at this time.  [facility] resources given and discussed.  Encouraged to feed with early cues on demand.  Early newborn behavior discussed.  Hand expression reported by mom with colostrum visible.  LC reviewed softening breast prior to latching when breasts are getting full.  Mom to call for assist as needed.    Patient Name: [name] Eben Burowrika Burkard BMWUX'LToday's Date: 09/14/2015 Reason for consult: Initial assessment   Maternal Data    Feeding Feeding Type: Breast Fed Length of feed: 10 min  LATCH Score/Interventions Latch: Grasps breast easily, tongue down, lips flanged, rhythmical sucking. Intervention(s): Adjust position;Assist with latch;Breast massage;Breast compression  Audible Swallowing: A few with stimulation Intervention(s): Hand expression;Skin to skin;Alternate breast massage  Type of Nipple: Everted at rest and after stimulation  Comfort (Breast/Nipple): Soft / non-tender     Hold (Positioning): No assistance needed to correctly position infant at breast.  LATCH Score: 9  Lactation Tools Discussed/Used     Consult Status Consult Status: Follow-up Date: 09/15/15 Follow-up type: In-patient    Jannifer RodneyShoptaw, Jana Lynn 09/14/2015, 10:07 PM

## 2015-09-14 NOTE — Anesthesia Postprocedure Evaluation (Signed)
Anesthesia Post Note  Patient: [name] Allen  Procedure(s) Performed: * No procedures listed *  Patient location during evaluation: Mother Baby Anesthesia Type: Epidural Level of consciousness: awake and alert and oriented Pain management: satisfactory to patient Vital Signs Assessment: post-procedure vital signs reviewed and stable Respiratory status: spontaneous breathing and nonlabored ventilation Cardiovascular status: stable Postop Assessment: no headache, no backache, no signs of nausea or vomiting, adequate PO intake and patient able to bend at knees (patient up walking) Anesthetic complications: no     Last Vitals:  Vitals:   09/14/15 1830 09/14/15 1930  BP: 113/62 123/61  Pulse: 80 80  Resp: 18 18  Temp: 36.8 C 36.7 C    Last Pain:  Vitals:   09/14/15 1930  TempSrc: Oral  PainSc: 0-No pain   Pain Goal:                 Shaquella Stamant

## 2015-09-14 NOTE — Anesthesia Pain Management Evaluation Note (Signed)
  CRNA Pain Management Visit Note  Patient: [name] Allen, 40 y.o., female  "Hello I am a [facility]. We have an anesthesia team available at all times to provide care throughout the hospital, including epidural management and anesthesia for C-section. I don't know your plan for the delivery whether it a natural birth, water birth, IV sedation, nitrous supplementation, doula or epidural, but we want to meet your pain goals."   1.Was your pain managed to your expectations on prior hospitalizations?   Yes   2.What is your expectation for pain management during this hospitalization?     Epidural  3.How can we help you reach that goal? epidural  Record the patient's initial score and the patient's pain goal.   Pain: 1  Pain Goal: 7 [facility] wants you to be able to say your pain was always managed very well.  Candra Wegner 09/14/2015

## 2015-09-14 NOTE — Anesthesia Procedure Notes (Signed)

## 2015-09-14 NOTE — Anesthesia Preprocedure Evaluation (Signed)

## 2015-09-14 NOTE — H&P (Signed)
[name] Allen is a 40 y.o. female presenting for induction of labor for post term pregnancy.  [id number]@G4P3003@ [redacted] weeks EGA.    With history of 3 prior vaginal deliveries in 2008, 2009 and 2011.  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  Sizes of babies in these three deliveries ranged from 6lbs 14 oz to 8lbs 5 oz.    OB History    Gravida Para Term Preterm AB Living   4 3 3     3    SAB TAB Ectopic Multiple Live Births           3     History reviewed. No pertinent past medical history. No past surgical history on file. Family History: family history is not on file. Social History:  reports that she has never smoked. She has never used smokeless tobacco. She reports that she does not drink alcohol or use drugs.     Maternal Diabetes: No Genetic Screening: Declined Maternal Ultrasounds/Referrals: Normal Fetal Ultrasounds or other Referrals:  None Maternal Substance Abuse:  No Significant Maternal Medications:  None Significant Maternal Lab Results:  None Other Comments:  None  ROS History  ROS  Constitutional: Denies fevers/chills Cardiovascular: Denies chest pain or palpitations Pulmonary: Denies coughing or wheezing Gastrointestinal: Denies nausea, vomiting or diarrhea Genitourinary: Denies pelvic pain, unusual vaginal bleeding, unusual vaginal discharge, dysuria, urgency or frequency.  Musculoskeletal: Denies muscle or joint aches and pain.  Neurology: Denies abnormal sensations such as tingling or numbness.   Dilation: 5 Effacement (%): 70 Station: -2 Exam by:: [name] ShaveYancey Luft, RN Blood pressure 122/75, pulse 87, temperature 98.1 F (36.7 C), temperature source Oral, resp. rate 16.  EFM: 120 baseline, mod variability, reactive, no decels. TOCO: Irregular contractions, Q 13 mins.   Exam Physical Exam  Constitutional: She is oriented to person, place, and  time. She appears well-developed and well-nourished.  HENT:  Head: Normocephalic and atraumatic.  Neck: Normal range of motion.  Cardiovascular: Normal rate, regular rhythm and normal heart sounds.   Respiratory: Effort normal and breath sounds normal.  GI: Soft. Bowel sounds are normal.  Gravid uterus.   Neurological: She is alert and oriented to person, place, and time.  Skin: Skin is warm and dry.  Psychiatric: She has a normal mood and affect. Her behavior is normal.   Prenatal labs: ABO, Rh: O/Positive/-- (12/28 0000) Antibody: Negative (12/28 0000) Rubella: Immune (12/28 0000) RPR: Nonreactive (05/26 0000)  HBsAg: Negative (12/28 0000)  HIV: Non-reactive (05/26 0000)  GBS: Negative (07/13 0000)  Pap smear 01/13/14: Negative.  Assessment/Plan: 40 y/o P3 here for induction of labor for post term pregnancy  Admit to labor and delivery Plan for Pitocin/AROM induction.  I discussed with patient risks, benefits and alternatives of labor induction including higher risk of C/S delivery compared to spontaneous labor.  All questions answered.     [name] Allen,[name] Allen [facility] 09/14/2015, 8:03 AM

## 2015-09-14 NOTE — Progress Notes (Signed)
Patient ambulated to the bathroom with the stedy, voided an adequate amount of clear yellow urine. Pericare done independently, then used the stedy to get back to bed. Patient states she will call out the next time she has to void and we will attempt to walk with assistance.

## 2015-09-14 NOTE — Progress Notes (Addendum)
[name] Allen is a 40 y.o. [phone] at [id number]  by LMP admitted for induction of labor due to post term pregnancy.   Subjective:  Patient comfortable, feels occasional contractions.   Objective: BP 131/80   Pulse 99   Temp 98.1 F (36.7 C) (Oral)   Resp 16  No intake/output data recorded. No intake/output data recorded.  FHT:  FHR: 130 bpm, variability: moderate,  accelerations:  Present,  decelerations:  Absent UC:   regular, every 4 minutes SVE:   Dilation: 4 Effacement (%): 50 Station: -2 Exam by:: MD Marialuisa Basara   AROM @ [id number], clear fluid. Cephalic presentation.   EFW by leopolds: 8lbs.   Labs: Lab Results  Component Value Date   WBC 8.3 09/14/2015   HGB 12.4 09/14/2015   HCT 36.5 09/14/2015   MCV 95.3 09/14/2015   PLT 186 09/14/2015    Assessment / Plan: Induction of labor due to postterm,  progressing well on pitocin  Labor: Progressing normally Fetal Wellbeing:  Category I Pain Control:  Epidural prn I/D:  n/a Anticipated MOD:  NSVD  [name] Allen WAKURU 09/14/2015, 9:19 AM

## 2015-09-15 ENCOUNTER — Encounter (HOSPITAL_COMMUNITY): Payer: Self-pay

## 2015-09-15 LAB — CBC
HEMATOCRIT: 33.3 % — AB (ref 36.0–46.0)
Hemoglobin: 11.3 g/dL — ABNORMAL LOW (ref 12.0–15.0)
MCH: 32.1 pg (ref 26.0–34.0)
MCHC: 33.9 g/dL (ref 30.0–36.0)
MCV: 94.6 fL (ref 78.0–100.0)
PLATELETS: 150 10*3/uL (ref 150–400)
RBC: 3.52 MIL/uL — ABNORMAL LOW (ref 3.87–5.11)
RDW: 13.1 % (ref 11.5–15.5)
WBC: 12.2 10*3/uL — AB (ref 4.0–10.5)

## 2015-09-15 MED ORDER — NORETHINDRONE 0.35 MG PO TABS: 1.0000 | ORAL_TABLET | Freq: Every day | ORAL | 11 refills | Status: AC

## 2015-09-15 MED ORDER — IBUPROFEN 600 MG PO TABS: 600.0000 mg | ORAL_TABLET | Freq: Four times a day (QID) | ORAL | 0 refills | Status: AC

## 2015-09-15 NOTE — Progress Notes (Signed)
Post Partum Day 1 Subjective:  Well. Lochia are normal. Voiding, ambulating, tolerating normal diet. nursing going well.  Objective: Blood pressure 116/71, pulse 66, temperature 98.1 F (36.7 C), temperature source Oral, resp. rate 18, height 5\' 1"  (1.549 m), weight 176 lb (79.8 kg), SpO2 99 %, unknown if currently breastfeeding.  Physical Exam:  General: normal Lochia: appropriate Uterine Fundus: 0/1 firm non-tender  Extremities: No evidence of DVT seen on physical exam. Edema minimal     Recent Labs  09/14/15 0740 09/15/15 0520  HGB 12.4 11.3*  HCT 36.5 33.3*    Assessment/Plan: Normal Post-partum.  D/C home today D/C instructions reviewed   LOS: 1 day   Idaly Verret A MD 09/15/2015, 1:20 PM

## 2015-09-15 NOTE — Lactation Note (Signed)
This note was copied from a baby's chart. Lactation Consultation Note; Experienced BF mom reports breast feeding is going well. Baby asleep in family member arms.No questions at present. Reviewed our phone number, OP appointments and BFSG as resources for support after DC. To call pen  Patient Name: [name] Eben Burowrika Fukushima [id number]'s Date: 09/15/2015 Reason for consult: Follow-up assessment   Maternal Data Formula Feeding for Exclusion: No Does the patient have breastfeeding experience prior to this delivery?: Yes  Feeding Feeding Type: Breast Fed Length of feed: 5 min  LATCH Score/Interventions Latch: Grasps breast easily, tongue down, lips flanged, rhythmical sucking.  Audible Swallowing: A few with stimulation Intervention(s): Skin to skin  Type of Nipple: Everted at rest and after stimulation  Comfort (Breast/Nipple): Soft / non-tender     Hold (Positioning): No assistance needed to correctly position infant at breast.  LATCH Score: 9  Lactation Tools Discussed/Used     Consult Status Consult Status: Complete    Pamelia HoitWeeks, [name] 09/15/2015, 3:09 PM

## 2015-09-15 NOTE — Discharge Summary (Signed)
  Obstetric Discharge Summary  Reason for Admission: induction of labor on 09/14/15 for post-dates Prenatal Procedures: none Intrapartum Procedures: spontaneous vaginal delivery by Dr Sallye OberKulwa on 09/14/15 Postpartum Procedures: none Complications-Operative and Postpartum: none  Hemoglobin  Date Value Ref Range Status  09/15/2015 11.3 (L) 12.0 - 15.0 g/dL Final   HCT  Date Value Ref Range Status  09/15/2015 33.3 (L) 36.0 - 46.0 % Final    Discharge Diagnoses: Term Pregnancy-delivered  Physical exam:   General: normal Lochia: appropriate Uterine Fundus: 0/1 firm non-tender  Extremities: No evidence of DVT seen on physical exam. Edema minimal   Hospital course: uncomplicated  Date: 09/15/2015 Activity: unrestricted Diet: routine Medications: Ibuprofen Condition: stable  Breastfeeding:   Yes.   Contraception:  Micronor to start 10/04/15 until vasectomy confirmed  Instructions: refer to practice specific booklet Discharge to: home   Newborn Data:   Baby female Name: Princess BruinsZoe  Frankie Scipio A MD 09/15/2015, 1:43 PM

## 2015-09-15 NOTE — Discharge Instructions (Signed)

## 2015-09-15 NOTE — Progress Notes (Signed)
UR chart review completed.
